# Patient Record
Sex: Male | Born: 1990 | Race: Black or African American | Hispanic: No | Marital: Single | Smoking: Never smoker
Health system: Southern US, Community
[De-identification: ages and names within clinical notes are randomized; demographics above are authoritative.]

---
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
## 2008-03-02 ENCOUNTER — Emergency Department (HOSPITAL_COMMUNITY): Admission: EM | Admit: 2008-03-02 | Discharge: 2008-03-02 | Payer: Self-pay | Admitting: Emergency Medicine

## 2008-06-12 ENCOUNTER — Emergency Department (HOSPITAL_COMMUNITY): Admission: EM | Admit: 2008-06-12 | Discharge: 2008-06-12 | Payer: Self-pay | Admitting: Emergency Medicine

## 2008-08-20 ENCOUNTER — Emergency Department (HOSPITAL_COMMUNITY): Admission: EM | Admit: 2008-08-20 | Discharge: 2008-08-20 | Payer: Self-pay | Admitting: Emergency Medicine

## 2009-04-23 ENCOUNTER — Emergency Department (HOSPITAL_COMMUNITY): Admission: EM | Admit: 2009-04-23 | Discharge: 2009-04-23 | Payer: Self-pay | Admitting: Family Medicine
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
## 2011-10-21 ENCOUNTER — Emergency Department (HOSPITAL_COMMUNITY)
Admission: EM | Admit: 2011-10-21 | Discharge: 2011-10-21 | Disposition: A | Discharge status: Home / Self Care | Payer: Self-pay | Attending: Emergency Medicine | Admitting: Emergency Medicine

## 2011-10-21 ENCOUNTER — Encounter (HOSPITAL_COMMUNITY): Payer: Self-pay | Admitting: Emergency Medicine

## 2011-10-21 DIAGNOSIS — W57XXXA Bitten or stung by nonvenomous insect and other nonvenomous arthropods, initial encounter: Secondary | ICD-10-CM | POA: Insufficient documentation

## 2011-10-21 DIAGNOSIS — T148 Other injury of unspecified body region: Secondary | ICD-10-CM | POA: Insufficient documentation

## 2011-10-21 NOTE — Discharge Instructions (Signed)
Keep areas very clean, wash with soap and water 2x/day. You may take benadryl as need for itching/symptom relief. Return to ER if worse, spreading redness, increased swelling, fevers, severe pain, other concern.       Insect Bite Mosquitoes, flies, fleas, bedbugs, and many other insects can bite. Insect bites are different from insect stings. A sting is when venom is injected into the skin. Some insect bites can transmit infectious diseases. SYMPTOMS  Insect bites usually turn red, swell, and itch for 2 to 4 days. They often go away on their own. TREATMENT  Your caregiver may prescribe antibiotic medicines if a bacterial infection develops in the bite. HOME CARE INSTRUCTIONS  Do not scratch the bite area.   Keep the bite area clean and dry. Wash the bite area thoroughly with soap and water.   Put ice or cool compresses on the bite area.   Put ice in a plastic bag.   Place a towel between your skin and the bag.   Leave the ice on for 20 minutes, 4 times a day for the first 2 to 3 days, or as directed.   You may apply a baking soda paste, cortisone cream, or calamine lotion to the bite area as directed by your caregiver. This can help reduce itching and swelling.   Only take over-the-counter or prescription medicines as directed by your caregiver.   If you are given antibiotics, take them as directed. Finish them even if you start to feel better.  You may need a tetanus shot if:  You cannot remember when you had your last tetanus shot.   You have never had a tetanus shot.   The injury broke your skin.  If you get a tetanus shot, your arm may swell, get red, and feel warm to the touch. This is common and not a problem. If you need a tetanus shot and you choose not to have one, there is a rare chance of getting tetanus. Sickness from tetanus can be serious. SEEK IMMEDIATE MEDICAL CARE IF:   You have increased pain, redness, or swelling in the bite area.   You see a red line on  the skin coming from the bite.   You have a fever.   You have joint pain.   You have a headache or neck pain.   You have unusual weakness.   You have a rash.   You have chest pain or shortness of breath.   You have abdominal pain, nausea, or vomiting.   You feel unusually tired or sleepy.  MAKE SURE YOU:   Understand these instructions.   Will watch your condition.   Will get help right away if you are not doing well or get worse.  Document Released: 07/19/2004 Document Revised: 05/31/2011 Document Reviewed: 01/10/2011 [facility] Patient Information 2012 [place], [place].

## 2011-10-21 NOTE — ED Notes (Signed)
Pt reports insect bites to bilateral hands and neck since Friday.  Denies pain.

## 2011-10-21 NOTE — ED Provider Notes (Signed)
History     CSN: [id number]  Arrival date & time 10/21/11  1556   First MD Initiated Contact with Patient 10/21/11 1615      Chief Complaint  Patient presents with  . Insect Bite    (Consider location/radiation/quality/duration/timing/severity/associated sxs/prior treatment) The history is provided by the patient.  pt notes onset 2 days ago several erythematous lesions to dorsum bil hand and small patch base of neck that he felt was insect bite. Did not see any spider or insect on skin. Was out of doors, questioned whether possible mosquito. Itching localized to bites themselves. No generalized rash. No gen itching. No wheezing/sob.  No new soaps/detergents, or chemical exposure. No new medication use. Denies fever or chills, states does not feel ill. No spreading redness around areas.    History reviewed. No pertinent past medical history.  History reviewed. No pertinent past surgical history.  No family history on file.  History  Substance Use Topics  . Smoking status: Never Smoker   . Smokeless tobacco: Not on file  . Alcohol Use: Yes      Review of Systems  Constitutional: Negative for fever and chills.  Respiratory: Negative for shortness of breath.   Neurological: Negative for numbness and headaches.    Allergies  Shrimp  Home Medications   Current Outpatient Rx  Name Route Sig Dispense Refill  . DIPHENHYDRAMINE HCL 25 MG PO TABS Oral Take 25 mg by mouth every 6 (six) hours as needed. For allergies      BP 115/59  Pulse 82  Temp(Src) 98.1 F (36.7 C) (Oral)  Resp 18  SpO2 96%  Physical Exam  Nursing note and vitals reviewed. Constitutional: He is oriented to person, place, and time. He appears well-developed and well-nourished. No distress.  Eyes: Conjunctivae are normal.  Neck: Neck supple. No tracheal deviation present.  Cardiovascular: Normal rate.   Pulmonary/Chest: Effort normal. No accessory muscle usage. No respiratory distress.  Abdominal:  He exhibits no distension.  Musculoskeletal: Normal range of motion.  Neurological: He is alert and oriented to person, place, and time.  Skin: Skin is warm and dry.       Pt with 3 small patches of 3-5 erythematous, sl raised lesions approximately 1 cm diameter c/w insect bite/sting, to small area dorsum each hand and small area base left neck. No other rash/lesions noted. No fbs seen/felt. No palms/soles. No mm lesions. No cellulitis. No fluctuance.    Psychiatric: He has a normal mood and affect.    ED Course  Procedures (including critical care time)     MDM  Exam c/w insect bite/sting. No new lesions since 2 days ago, discussed diff, incl spider bites, fleas, bed bugs, other insect.  Currently no gen rash/symptoms, no new lesions, no infected bites/wounds.         Suzi Roots, MD 10/21/11 [phone]

## 2015-08-13 ENCOUNTER — Emergency Department (HOSPITAL_COMMUNITY)
Admission: EM | Admit: 2015-08-13 | Discharge: 2015-08-13 | Disposition: A | Discharge status: Home / Self Care | Payer: 59 | Attending: Emergency Medicine | Admitting: Emergency Medicine

## 2015-08-13 ENCOUNTER — Encounter (HOSPITAL_COMMUNITY): Payer: Self-pay | Admitting: *Deleted

## 2015-08-13 DIAGNOSIS — R110 Nausea: Secondary | ICD-10-CM | POA: Insufficient documentation

## 2015-08-13 DIAGNOSIS — J039 Acute tonsillitis, unspecified: Secondary | ICD-10-CM | POA: Insufficient documentation

## 2015-08-13 DIAGNOSIS — M545 Low back pain: Secondary | ICD-10-CM | POA: Insufficient documentation

## 2015-08-13 DIAGNOSIS — J029 Acute pharyngitis, unspecified: Secondary | ICD-10-CM | POA: Diagnosis present

## 2015-08-13 LAB — URINALYSIS, ROUTINE W REFLEX MICROSCOPIC
GLUCOSE, UA: NEGATIVE mg/dL
Leukocytes, UA: NEGATIVE
NITRITE: NEGATIVE
PH: 6.5 (ref 5.0–8.0)
Protein, ur: NEGATIVE mg/dL
Specific Gravity, Urine: 1.02 (ref 1.005–1.030)

## 2015-08-13 LAB — I-STAT CHEM 8, ED
BUN: 11 mg/dL (ref 6–20)
CREATININE: 0.9 mg/dL (ref 0.61–1.24)
Calcium, Ion: 1.11 mmol/L — ABNORMAL LOW (ref 1.12–1.23)
Chloride: 94 mmol/L — ABNORMAL LOW (ref 101–111)
Glucose, Bld: 95 mg/dL (ref 65–99)
HEMATOCRIT: 54 % — AB (ref 39.0–52.0)
HEMOGLOBIN: 18.4 g/dL — AB (ref 13.0–17.0)
Potassium: 3.8 mmol/L (ref 3.5–5.1)
Sodium: 138 mmol/L (ref 135–145)
TCO2: 28 mmol/L (ref 0–100)

## 2015-08-13 LAB — URINE MICROSCOPIC-ADD ON: WBC, UA: NONE SEEN WBC/hpf (ref 0–5)

## 2015-08-13 MED ORDER — CLINDAMYCIN HCL 300 MG PO CAPS: 300.0000 mg | ORAL_CAPSULE | Freq: Four times a day (QID) | ORAL | Status: AC

## 2015-08-13 MED ORDER — CLINDAMYCIN PHOSPHATE 600 MG/50ML IV SOLN
600.0000 mg | Freq: Once | INTRAVENOUS | Status: AC
  Administered 2015-08-13: 600 mg via INTRAVENOUS
  Filled 2015-08-13: qty 50

## 2015-08-13 MED ORDER — TRAMADOL HCL 50 MG PO TABS
50.0000 mg | ORAL_TABLET | Freq: Once | ORAL | Status: AC
  Administered 2015-08-13: 50 mg via ORAL
  Filled 2015-08-13: qty 1

## 2015-08-13 MED ORDER — KETOROLAC TROMETHAMINE 30 MG/ML IJ SOLN
30.0000 mg | Freq: Once | INTRAMUSCULAR | Status: AC
  Administered 2015-08-13: 30 mg via INTRAMUSCULAR
  Filled 2015-08-13: qty 1

## 2015-08-13 MED ORDER — DEXAMETHASONE SODIUM PHOSPHATE 10 MG/ML IJ SOLN
10.0000 mg | Freq: Once | INTRAMUSCULAR | Status: AC
  Administered 2015-08-13: 10 mg via INTRAMUSCULAR
  Filled 2015-08-13: qty 1

## 2015-08-13 MED ORDER — CLINDAMYCIN HCL 150 MG PO CAPS: 300.0000 mg | ORAL_CAPSULE | Freq: Once | ORAL | Status: DC

## 2015-08-13 NOTE — ED Notes (Signed)
[facility]. Pt was given PCN pills to take for a postive strep throat test. Pt also has viscous lidocaine for sore throat.

## 2015-08-13 NOTE — Discharge Instructions (Signed)
Take your medications as prescribed. Take all of your medications and do not save a or share them. Return to ED in 2 days if your symptoms are not improving or if you experience worsening symptoms, difficulty breathing or opening her jaw.  Tonsillitis Tonsillitis is an infection of the throat that causes the tonsils to become red, tender, and swollen. Tonsils are collections of lymphoid tissue at the back of the throat. Each tonsil has crevices (crypts). Tonsils help fight nose and throat infections and keep infection from spreading to other parts of the body for the first 18 months of life.  CAUSES Sudden (acute) tonsillitis is usually caused by infection with streptococcal bacteria. Long-lasting (chronic) tonsillitis occurs when the crypts of the tonsils become filled with pieces of food and bacteria, which makes it easy for the tonsils to become repeatedly infected. SYMPTOMS  Symptoms of tonsillitis include:  A sore throat, with possible difficulty swallowing.  White patches on the tonsils.  Fever.  Tiredness.  New episodes of snoring during sleep, when you did not snore before.  Small, foul-smelling, yellowish-white pieces of material (tonsilloliths) that you occasionally cough up or spit out. The tonsilloliths can also cause you to have bad breath. DIAGNOSIS Tonsillitis can be diagnosed through a physical exam. Diagnosis can be confirmed with the results of lab tests, including a throat culture. TREATMENT  The goals of tonsillitis treatment include the reduction of the severity and duration of symptoms and prevention of associated conditions. Symptoms of tonsillitis can be improved with the use of steroids to reduce the swelling. Tonsillitis caused by bacteria can be treated with antibiotic medicines. Usually, treatment with antibiotic medicines is started before the cause of the tonsillitis is known. However, if it is determined that the cause is not bacterial, antibiotic medicines will  not treat the tonsillitis. If attacks of tonsillitis are severe and frequent, your health care provider may recommend surgery to remove the tonsils (tonsillectomy). HOME CARE INSTRUCTIONS   Rest as much as possible and get plenty of sleep.  Drink plenty of fluids. While the throat is very sore, eat soft foods or liquids, such as sherbet, soups, or instant breakfast drinks.  Eat frozen ice pops.  Gargle with a warm or cold liquid to help soothe the throat. Mix 1/4 teaspoon of salt and 1/4 teaspoon of baking soda in 8 oz of water. SEEK MEDICAL CARE IF:   Large, tender lumps develop in your neck.  A rash develops.  A green, yellow-brown, or bloody substance is coughed up.  You are unable to swallow liquids or food for 24 hours.  You notice that only one of the tonsils is swollen. SEEK IMMEDIATE MEDICAL CARE IF:   You develop any new symptoms such as vomiting, severe headache, stiff neck, chest pain, or trouble breathing or swallowing.  You have severe throat pain along with drooling or voice changes.  You have severe pain, unrelieved with recommended medications.  You are unable to fully open the mouth.  You develop redness, swelling, or severe pain anywhere in the neck.  You have a fever. MAKE SURE YOU:   Understand these instructions.  Will watch your condition.  Will get help right away if you are not doing well or get worse.   This information is not intended to replace advice given to you by your health care provider. Make sure you discuss any questions you have with your health care provider.   Document Released: 03/21/2005 Document Revised: 07/02/2014 Document Reviewed: 11/28/2012 Elsevier Interactive  Patient Education 2016 [employer].

## 2015-08-13 NOTE — ED Notes (Signed)
Declined W/C at D/C and was escorted to lobby by RN. 

## 2015-08-13 NOTE — ED Provider Notes (Signed)
CSN: [id number]     Arrival date & time 08/13/15  0911 History  By signing my name below, I, Todd Morgan, attest that this documentation has been prepared under the direction and in the presence of [employer], PA-C. Electronically Signed: Placido Morgan, ED Scribe. 08/13/2015. 10:15 AM.    Chief Complaint  Patient presents with  . Sore Throat   The history is provided by the patient. No language interpreter was used.    HPI Comments: Todd Morgan is a 25 y.o. male who presents to the Emergency Department complaining of constant, moderate, sore throat onset 4 days ago. [facility] and tested positive for strep, received PCN pills and lidocaine mouth rinse which has provided no significant relief. He notes associated nausea, HA, voice change and mild, sharp, bilateral, right greater than left, low back pain that began yesterday and worsens when lying down. Also notes that he was lifting weights on Monday for the back pain started. Pt reports worsening throat pain when swallowing but denies difficulty swallowing. He denies v/d, fevers, chills, cough, dysuria, abd pain, rash, hematuria or any other associated symptoms at this time.    History reviewed. No pertinent past medical history. History reviewed. No pertinent past surgical history. History reviewed. No pertinent family history. Social History  Substance Use Topics  . Smoking status: Never Smoker   . Smokeless tobacco: None  . Alcohol Use: Yes    Review of Systems A complete 10 system review of systems was obtained and all systems are negative except as noted in the HPI and PMH.   Allergies  Shrimp  Home Medications   Prior to Admission medications   Medication Sig Start Date End Date Taking? Authorizing Provider  clindamycin (CLEOCIN) 300 MG capsule Take 1 capsule (300 mg total) by mouth 4 (four) times daily. X 7 days 08/13/15   Joycie Peek, PA-C  diphenhydrAMINE (BENADRYL) 25 MG  tablet Take 25 mg by mouth every 6 (six) hours as needed. For allergies    Historical Provider, MD   BP 131/82 mmHg  Pulse 92  Temp(Src) 99.1 F (37.3 C) (Oral)  Resp 20  Ht  (1.727 m)  Wt 83.008 kg  BMI 27.83 kg/m2  SpO2 100%    Physical Exam  Constitutional: He is oriented to person, place, and time. He appears well-developed and well-nourished.  HENT:  Head: Normocephalic and atraumatic.  Bilateral tonsillitis with exudate. Uvula is midline. Mildly muffled voice. Tolerating secretions well. No trismus  Eyes: EOM are normal.  Neck: Normal range of motion.  Cardiovascular: Normal rate.   Pulmonary/Chest: Effort normal. No respiratory distress.  Abdominal: Soft. There is no tenderness.  Musculoskeletal: Normal range of motion.  Mild tenderness to palpation in paraspinal musculature bilaterally in lumbar region  Neurological: He is alert and oriented to person, place, and time.  Skin: Skin is warm and dry.  Psychiatric: He has a normal mood and affect.  Nursing note and vitals reviewed.   ED Course  Procedures  DIAGNOSTIC STUDIES: Oxygen Saturation is 100% on RA, normal by my interpretation.    COORDINATION OF CARE: 10:13 AM Discussed next steps with pt. He verbalized understanding and is agreeable with the plan.   Labs Review Labs Reviewed  URINALYSIS, ROUTINE W REFLEX MICROSCOPIC (NOT AT [facility]) - Abnormal; Notable for the following:    Color, Urine AMBER (*)    APPearance CLOUDY (*)    Hgb urine dipstick SMALL (*)  Bilirubin Urine SMALL (*)    Ketones, ur >80 (*)    All other components within normal limits  URINE MICROSCOPIC-ADD ON - Abnormal; Notable for the following:    Squamous Epithelial / LPF 0-5 (*)    Bacteria, UA RARE (*)    All other components within normal limits  I-STAT CHEM 8, ED - Abnormal; Notable for the following:    Chloride 94 (*)    Calcium, Ion 1.11 (*)    Hemoglobin 18.4 (*)    HCT 54.0 (*)    All other components within normal  limits    Imaging Review No results found. I have personally reviewed and evaluated these lab results as part of my medical decision-making.   EKG Interpretation None     Meds given in ED:  Medications  dexamethasone (DECADRON) injection 10 mg (10 mg Intramuscular Given 08/13/15 1048)  ketorolac (TORADOL) 30 MG/ML injection 30 mg (30 mg Intramuscular Given 08/13/15 1048)  clindamycin (CLEOCIN) IVPB 600 mg (600 mg Intravenous New Bag/Given 08/13/15 1101)    New Prescriptions   CLINDAMYCIN (CLEOCIN) 300 MG CAPSULE    Take 1 capsule (300 mg total) by mouth 4 (four) times daily. X 7 days   Filed Vitals:   08/13/15 0937  BP: 131/82  Pulse: 92  Temp: 99.1 F (37.3 C)  TempSrc: Oral  Resp: 20  Height:  (1.727 m)  Weight: 83.008 kg  SpO2: 100%    MDM  Todd Morgan is a 25 y.o. male who comes in for evaluation of sore throat. Patient [facility] and prescribed oral penicillin to take. He has not had any improvement on this therapy. On arrival today, he is afebrile and hemodynamically stable. On exam he does have bilateral tonsillar swelling with exudate. He does have mildly muffled voice sounds. He also reports mild bilateral low back pain likely could be secondary to lifting weights on Monday. However plan to obtain i-STAT Chem-8 to evaluate creatinine, urinalysis. We'll also give clindamycin in the ED, Decadron and Toradol for discomfort. I-STAT chem 8 reassuring, urinalysis without overt evidence of infection. Small amount of hemoglobin and urine which could likely represent small stone. Discussed follow-up in 3 days for reevaluation. Discussed strict return precautions including worsening symptoms, difficulties breathing, opening jaw, drooling or fevers. He verbalizes understanding and agrees with this plan as well as subsequent discharge. [facility] nor to establish primary care for  reevaluation.  Discussed with my attending, Dr. Dalene Seltzer who also saw and evaluated the patient and agrees the plan. Final diagnoses:  Tonsillitis with exudate    I personally performed the services described in this documentation, which was scribed in my presence. The recorded information has been reviewed and is accurate.    Joycie Peek, PA-C 08/13/15 1423  Alvira Monday, MD 08/15/15 2249

## 2022-06-14 ENCOUNTER — Emergency Department (HOSPITAL_BASED_OUTPATIENT_CLINIC_OR_DEPARTMENT_OTHER)
Admission: EM | Admit: 2022-06-14 | Discharge: 2022-06-14 | Disposition: A | Discharge status: Home / Self Care | Payer: Self-pay | Attending: Emergency Medicine | Admitting: Emergency Medicine

## 2022-06-14 ENCOUNTER — Other Ambulatory Visit: Payer: Self-pay

## 2022-06-14 ENCOUNTER — Encounter (HOSPITAL_BASED_OUTPATIENT_CLINIC_OR_DEPARTMENT_OTHER): Payer: Self-pay | Admitting: Emergency Medicine

## 2022-06-14 ENCOUNTER — Emergency Department (HOSPITAL_BASED_OUTPATIENT_CLINIC_OR_DEPARTMENT_OTHER): Payer: Self-pay | Admitting: Radiology

## 2022-06-14 DIAGNOSIS — W208XXA Other cause of strike by thrown, projected or falling object, initial encounter: Secondary | ICD-10-CM | POA: Insufficient documentation

## 2022-06-14 DIAGNOSIS — S90111A Contusion of right great toe without damage to nail, initial encounter: Secondary | ICD-10-CM | POA: Insufficient documentation

## 2022-06-14 MED ORDER — TRAMADOL HCL 50 MG PO TABS: 50.0000 mg | ORAL_TABLET | Freq: Four times a day (QID) | ORAL | 0 refills | Status: AC | PRN

## 2022-06-14 MED ORDER — TRAMADOL HCL 50 MG PO TABS
50.0000 mg | ORAL_TABLET | Freq: Once | ORAL | Status: AC
  Administered 2022-06-14: 50 mg via ORAL
  Filled 2022-06-14: qty 1
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
## 2022-06-14 NOTE — Discharge Instructions (Signed)
Ice for 20 minutes every 2 hours while awake for the next 2 days.  Weightbearing as tolerated.  Take ibuprofen 600 mg every 6 hours as needed for pain.
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
## 2022-06-14 NOTE — ED Notes (Signed)
Pt agreeable with d/c plan as discussed by provider- this nurse has verbally reinforced d/c instructions and provided pt with written copy.  Pt acknowledges verbal understanding and denies any additional questions concerns needs- escorted to exit via w/c (declined to demonstrate back to this nurse proper crutch use; this nurse has demonstrated proper crutch user; verbalized instructions on proper use of crutches and provided pt written crutch use instructions- pt acknowledges verbal understanding )

## 2024-05-20 ENCOUNTER — Ambulatory Visit
Admission: RE | Admit: 2024-05-20 | Discharge: 2024-05-20 | Disposition: A | Discharge status: Home / Self Care | Payer: Self-pay | Source: Ambulatory Visit | Attending: Family Medicine | Admitting: Family Medicine

## 2024-05-20 VITALS — BP 128/87 | HR 82 | Temp 98.2°F | Resp 18

## 2024-05-20 DIAGNOSIS — J988 Other specified respiratory disorders: Secondary | ICD-10-CM

## 2024-05-20 DIAGNOSIS — J4520 Mild intermittent asthma, uncomplicated: Secondary | ICD-10-CM | POA: Diagnosis not present

## 2024-05-20 DIAGNOSIS — B9789 Other viral agents as the cause of diseases classified elsewhere: Secondary | ICD-10-CM | POA: Diagnosis not present

## 2024-05-20 MED ORDER — PREDNISONE 10 MG PO TABS: 30.0000 mg | ORAL_TABLET | Freq: Every day | ORAL | 0 refills | Status: AC

## 2024-05-20 MED ORDER — PROMETHAZINE-DM 6.25-15 MG/5ML PO SYRP: 5.0000 mL | ORAL_SOLUTION | Freq: Three times a day (TID) | ORAL | 0 refills | Status: AC | PRN

## 2024-05-20 MED ORDER — CETIRIZINE HCL 10 MG PO TABS: 10.0000 mg | ORAL_TABLET | Freq: Every day | ORAL | 0 refills | Status: AC

## 2024-05-20 MED ORDER — ALBUTEROL SULFATE HFA 108 (90 BASE) MCG/ACT IN AERS: 1.0000 | INHALATION_SPRAY | Freq: Four times a day (QID) | RESPIRATORY_TRACT | 0 refills | Status: AC | PRN

## 2024-05-20 NOTE — ED Triage Notes (Signed)
 Pt reports cough x 3 days. Mucinex and Vicks gives no relief.   Pt do not want COVID or Flu test.

## 2024-05-20 NOTE — Discharge Instructions (Signed)
 We will manage this as a viral respiratory infection likely worsened by your intermittent asthma. For sore throat or cough try using a honey-based tea. Use 3 teaspoons of honey with juice squeezed from half lemon. Place shaved pieces of ginger into 1/2-1 cup of water and warm over stove top. Then mix the ingredients and repeat every 4 hours as needed. Please take Tylenol 500mg -650mg  once every 6 hours for fevers, aches and pains. Hydrate very well with at least 2 liters (64 ounces) of water. Eat light meals such as soups (chicken and noodles, chicken wild rice, vegetable).  Do not eat any foods that you are allergic to.  Start an antihistamine like Zyrtec  (10mg  daily) for postnasal drainage, sinus congestion.  You can take this together with prednisone  to help with your coughing, coughing spells and chest pain, wheezing. Use cough syrup as needed.

## 2024-05-20 NOTE — ED Provider Notes (Signed)
 [facility]  Note:  This document was prepared using Conservation officer, historic buildings and may include unintentional dictation errors.  MRN: [id number] DOB: 12-22-1990  Subjective:   Todd Morgan is a 33 y.o. male presenting for 3 day history of persistent dry hacking cough, throat pain, drainage, mild runny nose. Has had coughing fits that elicit chest pain. Has a history of asthma, last needed an inhaler about 5 years ago. No smoking. Has exposure to second hand smoke.  Would like a refill of his inhaler.  No overt ear pain, sinus pain, shortness of breath or wheezing.  No current facility-administered medications for this encounter.  Current Outpatient Medications:    dextromethorphan-guaiFENesin (MUCINEX DM) 30-600 MG 12hr tablet, Take 1 tablet by mouth 2 (two) times daily., Disp: , Rfl:    DM-Phenylephrine-Acetaminophen (VICKS DAYQUIL COLD & FLU PO), Take by mouth., Disp: , Rfl:    clindamycin  (CLEOCIN ) 300 MG capsule, Take 1 capsule (300 mg total) by mouth 4 (four) times daily. X 7 days, Disp: 28 capsule, Rfl: 0   diphenhydrAMINE (BENADRYL) 25 MG tablet, Take 25 mg by mouth every 6 (six) hours as needed. For allergies, Disp: , Rfl:    traMADol  (ULTRAM ) 50 MG tablet, Take 1 tablet (50 mg total) by mouth every 6 (six) hours as needed., Disp: 10 tablet, Rfl: 0   Allergies  Allergen Reactions   Shellfish Allergy Anaphylaxis    History reviewed. No pertinent past medical history.   History reviewed. No pertinent surgical history.  History reviewed. No pertinent family history.  Social History   Tobacco Use   Smoking status: Never   Smokeless tobacco: Never  Vaping Use   Vaping status: Never Used  Substance Use Topics   Alcohol use: Yes    Comment: social   Drug use: Never    ROS   Objective:   Vitals: BP 128/87 (BP Location: Left Arm)   Pulse 82   Temp 98.2 F (36.8 C) (Oral)   Resp 18   SpO2 95%   Physical Exam Constitutional:       General: He is not in acute distress.    Appearance: Normal appearance. He is well-developed and normal weight. He is not ill-appearing, toxic-appearing or diaphoretic.  HENT:     Head: Normocephalic and atraumatic.     Right Ear: External ear normal.     Left Ear: External ear normal.     Nose: Congestion present. No rhinorrhea.     Mouth/Throat:     Mouth: Mucous membranes are moist.     Pharynx: No pharyngeal swelling, oropharyngeal exudate, posterior oropharyngeal erythema or uvula swelling.     Tonsils: No tonsillar exudate or tonsillar abscesses. 0 on the right. 0 on the left.     Comments: Thick streaks of postnasal drainage overlying pharynx.  Eyes:     General: No scleral icterus.       Right eye: No discharge.        Left eye: No discharge.     Extraocular Movements: Extraocular movements intact.  Cardiovascular:     Rate and Rhythm: Normal rate and regular rhythm.     Heart sounds: Normal heart sounds. No murmur heard.    No friction rub. No gallop.  Pulmonary:     Effort: Pulmonary effort is normal. No respiratory distress.     Breath sounds: Normal breath sounds. No stridor. No wheezing, rhonchi or rales.  Musculoskeletal:     Cervical back: Normal range  of motion.  Neurological:     Mental Status: He is alert and oriented to person, place, and time.  Psychiatric:        Mood and Affect: Mood normal.        Behavior: Behavior normal.        Thought Content: Thought content normal.        Judgment: Judgment normal.    Declined COVID and flu test.  Assessment and Plan :   PDMP not reviewed this encounter.  1. Viral respiratory infection   2. Mild intermittent asthma, uncomplicated    Recommended a oral prednisone  course of 30 mg for 5 days due to his respiratory symptoms, likely bronchospasms eliciting chest pain.  Refilled his albuterol .  Use supportive care otherwise for viral respiratory infection.  Deferred imaging given clear cardiopulmonary exam,  hemodynamically stable vital signs.  Counseled patient on potential for adverse effects with medications prescribed/recommended today, ER and return-to-clinic precautions discussed, patient verbalized understanding.    Christopher Savannah, PA-C 05/20/24 1434
